# Patient Record
Sex: Male | Born: 1945
Health system: Southern US, Community
[De-identification: ages and names within clinical notes are randomized; demographics above are authoritative.]

## PROBLEM LIST (undated history)

## (undated) DIAGNOSIS — I10 Essential (primary) hypertension: Secondary | ICD-10-CM

## (undated) HISTORY — PX: BACK SURGERY: SHX140

## (undated) HISTORY — PX: ORTHOPEDIC SURGERY: SHX850

---

## 1997-10-28 ENCOUNTER — Ambulatory Visit (HOSPITAL_BASED_OUTPATIENT_CLINIC_OR_DEPARTMENT_OTHER): Admission: RE | Admit: 1997-10-28 | Discharge: 1997-10-28 | Payer: Self-pay | Admitting: Orthopedic Surgery

## 1998-03-29 ENCOUNTER — Ambulatory Visit (HOSPITAL_COMMUNITY): Admission: RE | Admit: 1998-03-29 | Discharge: 1998-03-29 | Payer: Self-pay | Admitting: Orthopedic Surgery

## 1999-05-25 ENCOUNTER — Ambulatory Visit (HOSPITAL_BASED_OUTPATIENT_CLINIC_OR_DEPARTMENT_OTHER): Admission: RE | Admit: 1999-05-25 | Discharge: 1999-05-25 | Payer: Self-pay | Admitting: Orthopedic Surgery

## 2006-05-03 ENCOUNTER — Ambulatory Visit (HOSPITAL_COMMUNITY): Admission: RE | Admit: 2006-05-03 | Discharge: 2006-05-03 | Payer: Self-pay | Admitting: Pulmonary Disease

## 2006-05-18 ENCOUNTER — Ambulatory Visit (HOSPITAL_COMMUNITY): Admission: RE | Admit: 2006-05-18 | Discharge: 2006-05-18 | Payer: Self-pay | Admitting: Pulmonary Disease

## 2006-05-23 ENCOUNTER — Ambulatory Visit (HOSPITAL_COMMUNITY): Admission: RE | Admit: 2006-05-23 | Discharge: 2006-05-23 | Payer: Self-pay | Admitting: Pulmonary Disease

## 2006-05-29 ENCOUNTER — Ambulatory Visit (HOSPITAL_COMMUNITY): Admission: RE | Admit: 2006-05-29 | Discharge: 2006-05-29 | Payer: Self-pay | Admitting: Pulmonary Disease

## 2006-06-15 ENCOUNTER — Ambulatory Visit (HOSPITAL_COMMUNITY): Admission: RE | Admit: 2006-06-15 | Discharge: 2006-06-15 | Payer: Self-pay | Admitting: General Surgery

## 2006-10-04 ENCOUNTER — Observation Stay (HOSPITAL_COMMUNITY): Admission: RE | Admit: 2006-10-04 | Discharge: 2006-10-05 | Payer: Self-pay | Admitting: Neurosurgery

## 2007-03-12 ENCOUNTER — Ambulatory Visit (HOSPITAL_COMMUNITY): Admission: RE | Admit: 2007-03-12 | Discharge: 2007-03-12 | Payer: Self-pay | Admitting: Pulmonary Disease

## 2008-10-21 ENCOUNTER — Ambulatory Visit (HOSPITAL_COMMUNITY): Admission: RE | Admit: 2008-10-21 | Discharge: 2008-10-21 | Payer: Self-pay | Admitting: Pulmonary Disease

## 2010-06-14 NOTE — Op Note (Signed)
William Saunders, William Saunders               ACCOUNT NO.:  [id number]   MEDICAL RECORD NO.:  [id number]          PATIENT TYPE:  INP   LOCATION:  5714                         FACILITY:  [facility]   PHYSICIAN:  Clydene Fake, M.D.  DATE OF BIRTH:  [birth year]   DATE OF PROCEDURE:  10/04/2006  DATE OF DISCHARGE:                               OPERATIVE REPORT   PREOPERATIVE DIAGNOSIS:  Herniated nucleus pulposus, spondylosis with  myelopathy, cervical stenosis C4-C5 and C5-C6.   POSTOPERATIVE DIAGNOSIS:  Herniated nucleus pulposus, spondylosis with  myelopathy, cervical stenosis C4-C5 and C5-C6.   PROCEDURE:  Anterior cervical decompression, discectomy and fusion at C4-  C5 and C5-C6, with allograft bone, anterior cervical plate.   SURGEON:  Clydene Fake, M.D.   ASSISTANT:  Hilda Lias, M.D.   ANESTHESIA:  General endotracheal tube anesthesia.   BLOOD LOSS:  Minimal.   BLOOD REPLACED:  None.   DRAINS:  None.   COMPLICATIONS:  None.   REASON FOR PROCEDURE:  The patient is a 65 year old gentleman who has  had left sided neck pain and scapular pain and found to have brisk  reflexes and accompanied clonus, decreased sensation in the left C8  distribution, positive Spurling's to the left. MRI was done showing  spinal changes at multiple levels, worse at C4-C5 and C5-C6, with a  large central disc herniation causing cord compression on the left side  at C4-C5 and bilateral foraminal narrowing at C5-C6 due to spondylitic  change, worse on the left with some disc herniation.  The patient is  brought in for decompression and fusion at C4-C5 and C5-C6.   PROCEDURE IN DETAIL:  The patient was brought into the operating room  and general anesthesia induced.  The patient was placed in 10 pounds  halter traction, prepped and draped in a sterile fashion.  The site of  incision was injected 10 mL of 1% lidocaine with epinephrine.  An  incision was then made from the midline to the anterior border  of the  sternocleidomastoid muscle on the left side.  Neck incision taken down  to the platysma and hemostasis obtained with Bovie cauterization.  The  platysma was incised with a Bovie and blunt dissection taken through the  anterior cervical fascia to the anterior cervical spine.  Two needles  were placed in the interspaces, x-rays obtained, the top needle pushed  out until it was over the 5 body, the second needle was in the C5-C6  disc space.  The needles were removed as the C5-C6 disc space was  incised with a 15 blade and partial discectomy performed with pituitary  rongeurs.  The longus coli muscle was reflected laterally on each side  using the Bovie from C4 through C6 and a self-retaining retractor was  placed.  The C4-C5 and C5-C6 disc spaces were again incised with a 15  blade.  Discectomy was started pituitary rongeurs and curets.  Anterior  osteophytes were removed with Kerrison punches.  The microscope was  brought in for microdissection at this point.  Discectomy continued with  curets and pituitary rongeurs and then 1  and 2 mm Kerrison punches were  used to remove posterior ligament, posterior disc and osteophytes.  This  decompressed the central canal.  There was a large free fragment of disc  that was obtained at the C4-C5 space coming from behind the C5 vertical  body and this decompressed the central canal.  We were able to remove  the ligament and continue with the central decompression and then  bilateral foraminotomies at the C4-C5 level.  When we were finished, we  had good central and lateral decompression.  A high speed drill was used  to remove cartilaginous endplates.  We measured the height of the disc  space to be 5 mm.  Gelfoam was placed for hemostasis.  Attention was  taken to the C5-C6 level.  Again, a high speed drill was used to remove  cartilaginous endplates and posterior osteophytes, 1 and 2 mm Kerrison  punches were used to remove the posterior  osteophytes, the disc, and  ligament, decompressing the central canal and performing bilateral  foraminotomies.  There was significant pressure on the left side and  when we were finished, we had good decompression of the bilateral C6  roots along with the central canal.  Hemostasis was obtained with  Gelfoam and thrombin.  This was removed.  The height of the disc space  was measured to be 5 mm and a 5 mm allograft bone was tapped in place  and countersunk a couple of mm.  We removed the Gelfoam from C4-C5.  We  had good hemostasis.  We irrigated with antibiotic solution and tapped a  5 mm bone plug into place, countersinking a couple of mm.  Distraction  pins were removed.  Hemostasis was obtained with Gelfoam and thrombin.  The weight was removed from the traction.  The bone was firmly in place  and in good position.  A tressel anterior cervical plate was placed over  the anterior cervical spine.  Two screws were placed into C4, two in C5,  and two more into C6.  These were tightened down. Lateral x-rays were  obtained showing good position of the plate, screws, and interbody bone  plugs at the C4-C5 and C5-C6 level.  The wound was irrigated with  antibiotic solution.  The retractors were removed.  Hemostasis was  obtained with bipolar cauterization and Gelfoam and thrombin.  We  irrigated with antibiotic solution.  When we had good hemostasis, the  platysma was closed with 3-0 Vicryl interrupted sutures, subcutaneous  tissue closed with the same, and skin closed with benzoin and Steri-  Strips.  A dressing was placed.  The patient was placed in a soft  cervical collar, awakened from anesthesia, and transferred to the  recovery room in stable condition.           ______________________________  Clydene Fake, M.D.     JRH/MEDQ  D:  10/04/2006  T:  10/04/2006  Job:  [id number]

## 2010-06-17 NOTE — Op Note (Signed)
[place]  Patient:    SAMUELE, STOREY                        MRN: [id number] Proc. Date: 05/25/99 Attending:  Artist Pais. Mina Marble, M.D. CC:         Artist Pais. Mina Marble, M.D., [address], [place], [place] (2                           Operative Report  PREOPERATIVE DIAGNOSIS:  Left shoulder impingement syndrome.  POSTOPERATIVE DIAGNOSIS:  Left shoulder impingement syndrome.  PROCEDURE:  Left shoulder arthroscopy with subacromial decompression.  SURGEON:  Artist Pais. Mina Marble, M.D.  ASSISTANT:  Nicki Reaper, M.D.  ANESTHESIA:  General with interscalen block.  COMPLICATIONS:  None.  DRAINS:  None.  DESCRIPTION OF PROCEDURE:  Patient was taken to the operating room and after the induction of adequate general anesthesia, the left upper extremity was prepped nd draped in the usual sterile fashion.  Patient was placed in a beach chair position and all bony prominences were well-padded.  At this point in time, an 18-gauge spinal needle was used to enter the glenohumeral joint posteriorly and 30 cc of  normal saline were instilled into the joint.  Once this was done, the joint was  entered using a blunt trocar and visualization of the joint revealed an intact biceps tendon.  The subscapularis tendon was intact and there was no evidence of SLAP lesion present.  An 18-gauge spinal needle was then used to establish an anterior outflow portal.  Visualization of the rotator cuff revealed no rotator  cuff tear throughout the supraspinatus and infraspinatus portions of the rotator cuff.  At this point in time, the scope was placed in the subacromial space, where a significant synovial bursitis was encountered.  A lateral portal was established under direct vision and using a combination of a suction shaver and the ArthroCare wand, a complete bursectomy/synovectomy was performed of the subacromial space.  Once this was done, the anterolateral  edge of the acromion was identified and using a 4-0 bur as well as an ENT rasp through the lateral portal, a subacromial decompression was undertaken.  The [facility] joint appeared to be non-hypertrophic and  there was no evidence of distal clavicle arthritic changes; therefore, the distal clavicle was not excised.  After the subacromial decompression was completed, continuation of the bursectomy was performed into the posterior aspect of the acromion.  Once a complete bursectomy was performed, the instruments were removed from the portals, the portals were closed with 4-0 nylon and Steri-Strips, 4 x s, fluffs and a compressive shoulder dressing were applied.  The patient tolerated the procedure well and went to the recovery room in stable fashion.DD:  05/25/99 TD:  05/25/99 Job: [id number]

## 2010-06-17 NOTE — H&P (Signed)
NAME:  William Saunders, William Saunders               ACCOUNT NO.:  [id number]   MEDICAL RECORD NO.:  [id number]          PATIENT TYPE:  AMB   LOCATION:  DAY                           FACILITY:  [facility]   PHYSICIAN:  Dalia Heading, M.D.  DATE OF BIRTH:  25-Mar-1945   DATE OF ADMISSION:  DATE OF DISCHARGE:  LH                              HISTORY & PHYSICAL   CHIEF COMPLAINT:  Need for screening colonoscopy.   HISTORY OF PRESENT ILLNESS:  The patient is a 65 year old white male who  is referred for endoscopic evaluation.  He needs a colonoscopy for  screening purposes.  No abdominal pain, weight loss, nausea, vomiting,  diarrhea, constipation, melena, hematochezia have been noted.  He has  never had a colonoscopy.  There is no family history of colon carcinoma.   PAST MEDICAL HISTORY:  Includes hypertension.   PAST SURGICAL HISTORY:  Left elbow surgery, left shoulder surgery,  ganglion cyst removal.   CURRENT MEDICATIONS:  Advil, a blood pressure pill, Nexium.   ALLERGIES:  No known drug allergies.   REVIEW OF SYSTEMS:  The patient smokes 2 packs of cigarettes a day.  He  denies any alcohol use.  He denies any other cardiopulmonary  difficulties or bleeding disorders.   PHYSICAL EXAMINATION:  The patient is a well-developed, well-nourished  white male in no acute distress.  LUNGS:  Clear to auscultation with equal breath sounds bilaterally.  HEART:  Examination reveals a regular rate and rhythm without S3, S4 or  murmurs.  ABDOMEN:  Soft, nontender, nondistended.  No hepatosplenomegaly or  masses are noted.  RECTAL:  Examination deferred to the procedure.   IMPRESSION:  Need for screening colonoscopy, and the patient is  scheduled for a colonoscopy on Jun 15, 2006.  The risks and benefits of  the procedure including bleeding or perforation were fully explained to  the patient.  He gave informed consent.      Dalia Heading, M.D.  Electronically Signed     MAJ/MEDQ  D:  05/31/2006  T:   05/31/2006  Job:  [id number]   cc:   Ramon Dredge L. Juanetta Gosling, M.D.  Fax: [phone]

## 2010-11-11 LAB — URINALYSIS, ROUTINE W REFLEX MICROSCOPIC
Bilirubin Urine: NEGATIVE
Glucose, UA: NEGATIVE
Hgb urine dipstick: NEGATIVE
Protein, ur: NEGATIVE
Urobilinogen, UA: 0.2

## 2010-11-11 LAB — CBC
MCHC: 33.4
RDW: 14.1 — ABNORMAL HIGH

## 2010-11-11 LAB — BASIC METABOLIC PANEL
BUN: 11
Calcium: 9.5
Creatinine, Ser: 1.01
GFR calc non Af Amer: >60
Glucose, Bld: 106 — ABNORMAL HIGH

## 2010-11-11 LAB — PROTIME-INR
INR: 1
Prothrombin Time: 13.2

## 2011-08-24 ENCOUNTER — Encounter (HOSPITAL_COMMUNITY): Payer: Self-pay | Admitting: Emergency Medicine

## 2011-08-24 ENCOUNTER — Emergency Department (HOSPITAL_COMMUNITY)
Admission: EM | Admit: 2011-08-24 | Discharge: 2011-08-25 | Disposition: A | Discharge status: Home / Self Care | Payer: Medicare Other | Attending: Emergency Medicine | Admitting: Emergency Medicine

## 2011-08-24 DIAGNOSIS — Z8673 Personal history of transient ischemic attack (TIA), and cerebral infarction without residual deficits: Secondary | ICD-10-CM | POA: Insufficient documentation

## 2011-08-24 DIAGNOSIS — F17200 Nicotine dependence, unspecified, uncomplicated: Secondary | ICD-10-CM | POA: Insufficient documentation

## 2011-08-24 DIAGNOSIS — Z981 Arthrodesis status: Secondary | ICD-10-CM | POA: Insufficient documentation

## 2011-08-24 DIAGNOSIS — E119 Type 2 diabetes mellitus without complications: Secondary | ICD-10-CM | POA: Insufficient documentation

## 2011-08-24 DIAGNOSIS — I10 Essential (primary) hypertension: Secondary | ICD-10-CM | POA: Insufficient documentation

## 2011-08-24 HISTORY — DX: Essential (primary) hypertension: I10

## 2011-08-24 NOTE — ED Notes (Signed)
Patient c/o high blood pressure that started earlier tonight; also c/o headache; states it feels like a pressure.

## 2011-08-25 ENCOUNTER — Encounter (HOSPITAL_COMMUNITY): Payer: Self-pay | Admitting: *Deleted

## 2011-08-25 MED ORDER — CLONIDINE HCL 0.1 MG PO TABS
0.1000 mg | ORAL_TABLET | Freq: Once | ORAL | Status: AC
  Administered 2011-08-25: 0.1 mg via ORAL
  Filled 2011-08-25: qty 1

## 2011-08-25 MED ORDER — LORAZEPAM 1 MG PO TABS
0.5000 mg | ORAL_TABLET | Freq: Once | ORAL | Status: AC
  Administered 2011-08-25: 1 mg via ORAL
  Filled 2011-08-25: qty 1

## 2011-08-25 NOTE — ED Provider Notes (Signed)
History     CSN: [id number]  Arrival date & time 08/24/11  2346   First MD Initiated Contact with Patient 08/24/11 2356      Chief Complaint  Patient presents with  . Hypertension    (Consider location/radiation/quality/duration/timing/severity/associated sxs/prior treatment) HPI   William Saunders is a 66 y.o. male who presents to the Emergency Department complaining of high blood pressure. He is concerned because his brother died of a stroke and he, himself has had a TIA in the past.He had one of his blood pressure medicines (amlodipine) discontinued on 08/02/2011 when he developed lower extremity edema. He is now only on benazepril and occasional prn lasix. Blood pressures have been in the 140 range until tonight when he recorded 210-230/102-110. He denies vision changes, headache, difficulty speaking or swallowing, focal weakness.  PCP Dr. Juanetta Gosling   Past Medical History  Diagnosis Date  . Hypertension   . Diabetes mellitus     Past Surgical History  Procedure Date  . Orthopedic surgery   . Back surgery     C4,5,6 Fusion    No family history on file.  History  Substance Use Topics  . Smoking status: Current Everyday Smoker -- 2.0 packs/day    Types: Cigarettes  . Smokeless tobacco: Not on file  . Alcohol Use: No      Review of Systems  Constitutional: Negative for fever.       10 Systems reviewed and are negative for acute change except as noted in the HPI.  HENT: Negative for congestion.   Eyes: Negative for discharge and redness.  Respiratory: Negative for cough and shortness of breath.   Cardiovascular: Negative for chest pain.  Gastrointestinal: Negative for vomiting and abdominal pain.  Musculoskeletal: Negative for back pain.  Skin: Negative for rash.  Neurological: Negative for syncope, numbness and headaches.  Psychiatric/Behavioral:       No behavior change.    Allergies  Review of patient's allergies indicates no known allergies.  Home  Medications   Current Outpatient Rx  Name Route Sig Dispense Refill  . ALPRAZOLAM 1 MG PO TABS Oral Take 1 mg by mouth at bedtime as needed.    . ASPIRIN 81 MG PO TABS Oral Take 81 mg by mouth daily.    Marland Kitchen BENAZEPRIL HCL 40 MG PO TABS Oral Take 40 mg by mouth daily.    . FUROSEMIDE 40 MG PO TABS Oral Take 40 mg by mouth daily.    Marland Kitchen HYDROCODONE-ACETAMINOPHEN 10-325 MG PO TABS Oral Take 1 tablet by mouth every 6 (six) hours as needed.    Marland Kitchen OMEPRAZOLE 20 MG PO CPDR Oral Take 20 mg by mouth daily.    Marland Kitchen POTASSIUM CHLORIDE CRYS ER 20 MEQ PO TBCR Oral Take 20 mEq by mouth as needed.      BP 213/99  Pulse 73  Temp 98.3 F (36.8 C) (Oral)  Resp 18  Ht 5\' 11"  (1.803 m)  Wt 235 lb (106.595 kg)  BMI 32.78 kg/m2  SpO2 98%  Physical Exam  Nursing note and vitals reviewed. Constitutional: He is oriented to person, place, and time. He appears well-developed and well-nourished.       Awake, alert, nontoxic appearance.Anxious  HENT:  Head: Atraumatic.  Eyes: Right eye exhibits no discharge. Left eye exhibits no discharge.  Neck: Neck supple.  Cardiovascular: Normal heart sounds.   Pulmonary/Chest: Effort normal and breath sounds normal. He exhibits no tenderness.  Abdominal: Soft. Bowel sounds are normal. There is no tenderness.  There is no rebound.  Musculoskeletal: He exhibits edema. He exhibits no tenderness.       Baseline ROM, no obvious new focal weakness.1+ edema, no pitting  Neurological: He is alert and oriented to person, place, and time. No cranial nerve deficit. Coordination normal.       Mental status and motor strength appears baseline for patient and situation.  Skin: No rash noted.  Psychiatric: He has a normal mood and affect.    ED Course  Procedures (including critical care time)      MDM  Patient here with elevated blood pressure, anxious regarding his having a stroke. Initial BP 213/99. Given clonidine and ativan  with blood pressure response 174/98 and decrease in  anxiety. Discussed changes in medication and f/u with Dr. Juanetta Gosling. Pt stable in ED with no significant deterioration in condition.The patient appears reasonably screened and/or stabilized for discharge and I doubt any other medical condition or other [facility] requiring further screening, evaluation, or treatment in the ED at this time prior to discharge.  MDM Reviewed: nursing note and vitals           Nicoletta Dress. Colon Branch, MD 08/25/11 4098

## 2011-08-25 NOTE — ED Notes (Signed)
Pt c/o slight head pressure; otherwise assessment unremarkable

## 2011-08-25 NOTE — ED Notes (Signed)
A&ox4; in no distress; instructions reviewed and f/u information provided.  Verbalizes understanding.

## 2014-02-18 DIAGNOSIS — G629 Polyneuropathy, unspecified: Secondary | ICD-10-CM | POA: Diagnosis not present

## 2014-02-18 DIAGNOSIS — E785 Hyperlipidemia, unspecified: Secondary | ICD-10-CM | POA: Diagnosis not present

## 2014-02-18 DIAGNOSIS — M545 Low back pain: Secondary | ICD-10-CM | POA: Diagnosis not present

## 2014-02-18 DIAGNOSIS — E119 Type 2 diabetes mellitus without complications: Secondary | ICD-10-CM | POA: Diagnosis not present

## 2014-02-19 DIAGNOSIS — I10 Essential (primary) hypertension: Secondary | ICD-10-CM | POA: Diagnosis not present

## 2014-02-19 DIAGNOSIS — E119 Type 2 diabetes mellitus without complications: Secondary | ICD-10-CM | POA: Diagnosis not present

## 2014-02-19 DIAGNOSIS — E785 Hyperlipidemia, unspecified: Secondary | ICD-10-CM | POA: Diagnosis not present

## 2014-05-05 DIAGNOSIS — L0291 Cutaneous abscess, unspecified: Secondary | ICD-10-CM | POA: Diagnosis not present

## 2014-05-20 ENCOUNTER — Other Ambulatory Visit (HOSPITAL_COMMUNITY): Payer: Self-pay | Admitting: Pulmonary Disease

## 2014-05-20 DIAGNOSIS — I739 Peripheral vascular disease, unspecified: Secondary | ICD-10-CM | POA: Diagnosis not present

## 2014-05-20 DIAGNOSIS — R252 Cramp and spasm: Secondary | ICD-10-CM | POA: Diagnosis not present

## 2014-05-20 DIAGNOSIS — I10 Essential (primary) hypertension: Secondary | ICD-10-CM | POA: Diagnosis not present

## 2014-05-20 DIAGNOSIS — E1165 Type 2 diabetes mellitus with hyperglycemia: Secondary | ICD-10-CM | POA: Diagnosis not present

## 2014-05-22 ENCOUNTER — Ambulatory Visit (HOSPITAL_COMMUNITY)
Admission: RE | Admit: 2014-05-22 | Discharge: 2014-05-22 | Disposition: A | Discharge status: Home / Self Care | Payer: Medicare Other | Source: Ambulatory Visit | Attending: Pulmonary Disease | Admitting: Pulmonary Disease

## 2014-05-22 DIAGNOSIS — I739 Peripheral vascular disease, unspecified: Secondary | ICD-10-CM

## 2014-05-22 DIAGNOSIS — M79604 Pain in right leg: Secondary | ICD-10-CM | POA: Diagnosis not present

## 2014-05-22 DIAGNOSIS — I10 Essential (primary) hypertension: Secondary | ICD-10-CM | POA: Diagnosis not present

## 2014-05-22 DIAGNOSIS — M79605 Pain in left leg: Secondary | ICD-10-CM | POA: Diagnosis not present

## 2014-05-22 DIAGNOSIS — R252 Cramp and spasm: Secondary | ICD-10-CM | POA: Diagnosis not present

## 2014-05-22 DIAGNOSIS — E1165 Type 2 diabetes mellitus with hyperglycemia: Secondary | ICD-10-CM | POA: Diagnosis not present

## 2014-08-24 DIAGNOSIS — Z1211 Encounter for screening for malignant neoplasm of colon: Secondary | ICD-10-CM | POA: Diagnosis not present

## 2014-08-24 DIAGNOSIS — J449 Chronic obstructive pulmonary disease, unspecified: Secondary | ICD-10-CM | POA: Diagnosis not present

## 2014-08-24 DIAGNOSIS — I10 Essential (primary) hypertension: Secondary | ICD-10-CM | POA: Diagnosis not present

## 2014-08-24 DIAGNOSIS — E236 Other disorders of pituitary gland: Secondary | ICD-10-CM | POA: Diagnosis not present

## 2014-08-24 DIAGNOSIS — E785 Hyperlipidemia, unspecified: Secondary | ICD-10-CM | POA: Diagnosis not present

## 2014-08-24 DIAGNOSIS — Z0000 Encounter for general adult medical examination without abnormal findings: Secondary | ICD-10-CM | POA: Diagnosis not present

## 2014-08-24 DIAGNOSIS — E1165 Type 2 diabetes mellitus with hyperglycemia: Secondary | ICD-10-CM | POA: Diagnosis not present

## 2014-11-23 DIAGNOSIS — G629 Polyneuropathy, unspecified: Secondary | ICD-10-CM | POA: Diagnosis not present

## 2014-11-23 DIAGNOSIS — Z23 Encounter for immunization: Secondary | ICD-10-CM | POA: Diagnosis not present

## 2014-11-23 DIAGNOSIS — J449 Chronic obstructive pulmonary disease, unspecified: Secondary | ICD-10-CM | POA: Diagnosis not present

## 2014-11-23 DIAGNOSIS — I10 Essential (primary) hypertension: Secondary | ICD-10-CM | POA: Diagnosis not present

## 2014-11-23 DIAGNOSIS — M545 Low back pain: Secondary | ICD-10-CM | POA: Diagnosis not present

## 2014-11-24 DIAGNOSIS — E1165 Type 2 diabetes mellitus with hyperglycemia: Secondary | ICD-10-CM | POA: Diagnosis not present

## 2015-02-23 DIAGNOSIS — M1990 Unspecified osteoarthritis, unspecified site: Secondary | ICD-10-CM | POA: Diagnosis not present

## 2015-02-23 DIAGNOSIS — E1165 Type 2 diabetes mellitus with hyperglycemia: Secondary | ICD-10-CM | POA: Diagnosis not present

## 2015-02-23 DIAGNOSIS — K210 Gastro-esophageal reflux disease with esophagitis: Secondary | ICD-10-CM | POA: Diagnosis not present

## 2015-02-23 DIAGNOSIS — J449 Chronic obstructive pulmonary disease, unspecified: Secondary | ICD-10-CM | POA: Diagnosis not present

## 2015-02-23 DIAGNOSIS — M25512 Pain in left shoulder: Secondary | ICD-10-CM | POA: Diagnosis not present

## 2015-02-23 DIAGNOSIS — I10 Essential (primary) hypertension: Secondary | ICD-10-CM | POA: Diagnosis not present

## 2015-03-08 DIAGNOSIS — M25512 Pain in left shoulder: Secondary | ICD-10-CM | POA: Diagnosis not present

## 2015-03-08 DIAGNOSIS — G8929 Other chronic pain: Secondary | ICD-10-CM | POA: Diagnosis not present

## 2015-03-09 ENCOUNTER — Other Ambulatory Visit (HOSPITAL_COMMUNITY): Payer: Self-pay | Admitting: Orthopedic Surgery

## 2015-03-09 DIAGNOSIS — G8929 Other chronic pain: Secondary | ICD-10-CM

## 2015-03-09 DIAGNOSIS — M25512 Pain in left shoulder: Principal | ICD-10-CM

## 2015-03-19 ENCOUNTER — Ambulatory Visit (HOSPITAL_COMMUNITY): Payer: Medicare Other

## 2015-03-22 ENCOUNTER — Ambulatory Visit (HOSPITAL_COMMUNITY)
Admission: RE | Admit: 2015-03-22 | Discharge: 2015-03-22 | Disposition: A | Discharge status: Home / Self Care | Payer: Medicare Other | Source: Ambulatory Visit | Attending: Orthopedic Surgery | Admitting: Orthopedic Surgery

## 2015-03-22 DIAGNOSIS — M7582 Other shoulder lesions, left shoulder: Secondary | ICD-10-CM | POA: Diagnosis not present

## 2015-03-22 DIAGNOSIS — R600 Localized edema: Secondary | ICD-10-CM | POA: Insufficient documentation

## 2015-03-22 DIAGNOSIS — M25512 Pain in left shoulder: Secondary | ICD-10-CM | POA: Diagnosis not present

## 2015-03-22 DIAGNOSIS — M19012 Primary osteoarthritis, left shoulder: Secondary | ICD-10-CM | POA: Insufficient documentation

## 2015-03-22 DIAGNOSIS — G8929 Other chronic pain: Secondary | ICD-10-CM | POA: Insufficient documentation

## 2015-03-22 DIAGNOSIS — M75112 Incomplete rotator cuff tear or rupture of left shoulder, not specified as traumatic: Secondary | ICD-10-CM | POA: Insufficient documentation

## 2015-03-23 DIAGNOSIS — M25512 Pain in left shoulder: Secondary | ICD-10-CM | POA: Diagnosis not present

## 2015-04-13 DIAGNOSIS — M25512 Pain in left shoulder: Secondary | ICD-10-CM | POA: Diagnosis not present

## 2015-04-13 DIAGNOSIS — G8929 Other chronic pain: Secondary | ICD-10-CM | POA: Diagnosis not present

## 2015-04-15 ENCOUNTER — Other Ambulatory Visit: Payer: Self-pay | Admitting: Orthopedic Surgery

## 2015-04-15 ENCOUNTER — Ambulatory Visit
Admission: RE | Admit: 2015-04-15 | Discharge: 2015-04-15 | Disposition: A | Discharge status: Home / Self Care | Payer: Medicare Other | Source: Ambulatory Visit | Attending: Orthopedic Surgery | Admitting: Orthopedic Surgery

## 2015-04-15 DIAGNOSIS — R0602 Shortness of breath: Secondary | ICD-10-CM

## 2015-04-15 DIAGNOSIS — M25512 Pain in left shoulder: Secondary | ICD-10-CM | POA: Diagnosis not present

## 2015-04-15 DIAGNOSIS — Z01818 Encounter for other preprocedural examination: Secondary | ICD-10-CM | POA: Diagnosis not present

## 2015-04-20 DIAGNOSIS — E1165 Type 2 diabetes mellitus with hyperglycemia: Secondary | ICD-10-CM | POA: Diagnosis not present

## 2015-04-20 DIAGNOSIS — I10 Essential (primary) hypertension: Secondary | ICD-10-CM | POA: Diagnosis not present

## 2015-04-20 DIAGNOSIS — E1121 Type 2 diabetes mellitus with diabetic nephropathy: Secondary | ICD-10-CM | POA: Diagnosis not present

## 2015-04-20 DIAGNOSIS — J449 Chronic obstructive pulmonary disease, unspecified: Secondary | ICD-10-CM | POA: Diagnosis not present

## 2015-05-11 ENCOUNTER — Encounter (HOSPITAL_BASED_OUTPATIENT_CLINIC_OR_DEPARTMENT_OTHER): Admission: RE | Payer: Self-pay | Source: Ambulatory Visit

## 2015-05-11 ENCOUNTER — Ambulatory Visit (HOSPITAL_BASED_OUTPATIENT_CLINIC_OR_DEPARTMENT_OTHER): Admission: RE | Admit: 2015-05-11 | Payer: Medicare Other | Source: Ambulatory Visit | Admitting: Orthopedic Surgery

## 2015-05-11 SURGERY — SHOULDER ARTHROSCOPY WITH SUBACROMIAL DECOMPRESSION, ROTATOR CUFF REPAIR AND BICEP TENDON REPAIR
Anesthesia: General | Laterality: Left

## 2015-07-26 DIAGNOSIS — E236 Other disorders of pituitary gland: Secondary | ICD-10-CM | POA: Diagnosis not present

## 2015-07-26 DIAGNOSIS — G4700 Insomnia, unspecified: Secondary | ICD-10-CM | POA: Diagnosis not present

## 2015-07-26 DIAGNOSIS — I10 Essential (primary) hypertension: Secondary | ICD-10-CM | POA: Diagnosis not present

## 2015-07-26 DIAGNOSIS — Z0000 Encounter for general adult medical examination without abnormal findings: Secondary | ICD-10-CM | POA: Diagnosis not present

## 2015-07-26 DIAGNOSIS — Z125 Encounter for screening for malignant neoplasm of prostate: Secondary | ICD-10-CM | POA: Diagnosis not present

## 2015-07-26 DIAGNOSIS — E1165 Type 2 diabetes mellitus with hyperglycemia: Secondary | ICD-10-CM | POA: Diagnosis not present

## 2015-08-09 DIAGNOSIS — C44619 Basal cell carcinoma of skin of left upper limb, including shoulder: Secondary | ICD-10-CM | POA: Diagnosis not present

## 2015-08-09 DIAGNOSIS — C44219 Basal cell carcinoma of skin of left ear and external auricular canal: Secondary | ICD-10-CM | POA: Diagnosis not present

## 2015-08-09 DIAGNOSIS — C44212 Basal cell carcinoma of skin of right ear and external auricular canal: Secondary | ICD-10-CM | POA: Diagnosis not present

## 2015-08-30 DIAGNOSIS — L570 Actinic keratosis: Secondary | ICD-10-CM | POA: Diagnosis not present

## 2015-08-30 DIAGNOSIS — Z85828 Personal history of other malignant neoplasm of skin: Secondary | ICD-10-CM | POA: Diagnosis not present

## 2015-08-30 DIAGNOSIS — C44612 Basal cell carcinoma of skin of right upper limb, including shoulder: Secondary | ICD-10-CM | POA: Diagnosis not present

## 2015-08-30 DIAGNOSIS — Z08 Encounter for follow-up examination after completed treatment for malignant neoplasm: Secondary | ICD-10-CM | POA: Diagnosis not present

## 2015-08-30 DIAGNOSIS — X32XXXD Exposure to sunlight, subsequent encounter: Secondary | ICD-10-CM | POA: Diagnosis not present

## 2015-10-25 DIAGNOSIS — L570 Actinic keratosis: Secondary | ICD-10-CM | POA: Diagnosis not present

## 2015-10-25 DIAGNOSIS — C44612 Basal cell carcinoma of skin of right upper limb, including shoulder: Secondary | ICD-10-CM | POA: Diagnosis not present

## 2015-10-25 DIAGNOSIS — X32XXXD Exposure to sunlight, subsequent encounter: Secondary | ICD-10-CM | POA: Diagnosis not present

## 2016-02-29 DIAGNOSIS — E1121 Type 2 diabetes mellitus with diabetic nephropathy: Secondary | ICD-10-CM | POA: Diagnosis not present

## 2016-02-29 DIAGNOSIS — I10 Essential (primary) hypertension: Secondary | ICD-10-CM | POA: Diagnosis not present

## 2016-02-29 DIAGNOSIS — M549 Dorsalgia, unspecified: Secondary | ICD-10-CM | POA: Diagnosis not present

## 2016-02-29 DIAGNOSIS — J449 Chronic obstructive pulmonary disease, unspecified: Secondary | ICD-10-CM | POA: Diagnosis not present

## 2016-04-24 DIAGNOSIS — B078 Other viral warts: Secondary | ICD-10-CM | POA: Diagnosis not present

## 2016-04-24 DIAGNOSIS — X32XXXD Exposure to sunlight, subsequent encounter: Secondary | ICD-10-CM | POA: Diagnosis not present

## 2016-04-24 DIAGNOSIS — Z85828 Personal history of other malignant neoplasm of skin: Secondary | ICD-10-CM | POA: Diagnosis not present

## 2016-04-24 DIAGNOSIS — Z08 Encounter for follow-up examination after completed treatment for malignant neoplasm: Secondary | ICD-10-CM | POA: Diagnosis not present

## 2016-04-24 DIAGNOSIS — L570 Actinic keratosis: Secondary | ICD-10-CM | POA: Diagnosis not present

## 2016-05-29 DIAGNOSIS — M545 Low back pain: Secondary | ICD-10-CM | POA: Diagnosis not present

## 2016-05-29 DIAGNOSIS — I10 Essential (primary) hypertension: Secondary | ICD-10-CM | POA: Diagnosis not present

## 2016-05-29 DIAGNOSIS — J449 Chronic obstructive pulmonary disease, unspecified: Secondary | ICD-10-CM | POA: Diagnosis not present

## 2016-05-29 DIAGNOSIS — E1165 Type 2 diabetes mellitus with hyperglycemia: Secondary | ICD-10-CM | POA: Diagnosis not present

## 2016-09-19 DIAGNOSIS — R609 Edema, unspecified: Secondary | ICD-10-CM | POA: Diagnosis not present

## 2016-09-19 DIAGNOSIS — I10 Essential (primary) hypertension: Secondary | ICD-10-CM | POA: Diagnosis not present

## 2016-09-19 DIAGNOSIS — J449 Chronic obstructive pulmonary disease, unspecified: Secondary | ICD-10-CM | POA: Diagnosis not present

## 2016-09-19 DIAGNOSIS — G629 Polyneuropathy, unspecified: Secondary | ICD-10-CM | POA: Diagnosis not present

## 2016-10-05 ENCOUNTER — Other Ambulatory Visit (HOSPITAL_COMMUNITY): Payer: Self-pay | Admitting: Pulmonary Disease

## 2016-10-05 DIAGNOSIS — R609 Edema, unspecified: Secondary | ICD-10-CM

## 2016-10-09 ENCOUNTER — Ambulatory Visit (HOSPITAL_COMMUNITY)
Admission: RE | Admit: 2016-10-09 | Discharge: 2016-10-09 | Disposition: A | Discharge status: Home / Self Care | Payer: Medicare Other | Source: Ambulatory Visit | Attending: Pulmonary Disease | Admitting: Pulmonary Disease

## 2016-10-09 DIAGNOSIS — R609 Edema, unspecified: Secondary | ICD-10-CM | POA: Insufficient documentation

## 2016-10-09 LAB — ECHOCARDIOGRAM COMPLETE
CHL CUP MV DEC (S): 366
CHL CUP STROKE VOLUME: 43 mL
CHL CUP TV REG PEAK VELOCITY: 277 cm/s
E/e' ratio: 8.34
EWDT: 366 ms
FS: 52 % — AB (ref 28–44)
IV/PV OW: 1.32
LA ID, A-P, ES: 24 mm
LA diam end sys: 24 mm
LA diam index: 1.02 cm/m2
LA vol A4C: 43.8 ml
LDCA: 2.84 cm2
LV E/e' medial: 8.34
LV E/e'average: 8.34
LV TDI E'MEDIAL: 6.31
LV e' LATERAL: 9.25 cm/s
LVDIAVOL: 61 mL — AB (ref 62–150)
LVDIAVOLIN: 26 mL/m2
LVOT VTI: 26.7 cm
LVOT peak grad rest: 9 mmHg
LVOTD: 19 mm
LVOTPV: 149 cm/s
LVOTSV: 76 mL
LVSYSVOL: 19 mL — AB (ref 21–61)
LVSYSVOLIN: 8 mL/m2
Lateral S' vel: 12.7 cm/s
MV Peak grad: 2 mmHg
MV pk A vel: 113 m/s
MV pk E vel: 77.1 m/s
PW: 10.7 mm — AB (ref 0.6–1.1)
RV TAPSE: 28.5 mm
RV sys press: 34 mmHg
Simpson's disk: 69
TDI e' lateral: 9.25
TR max vel: 277 cm/s

## 2016-10-09 NOTE — Progress Notes (Signed)
*  PRELIMINARY RESULTS* Echocardiogram 2D Echocardiogram has been performed.  William Saunders 10/09/2016, 3:35 PM

## 2016-10-19 IMAGING — MR MR SHOULDER*L* W/O CM
5 series · 40 of 40 positions shown · non-contrast
Comparison: None.

CLINICAL DATA: Left shoulder pain for 2 months.

EXAM:
MRI OF THE LEFT SHOULDER WITHOUT CONTRAST
TECHNIQUE: Multiplanar, multisequence MR imaging of the shoulder was performed.
No intravenous contrast was administered.

[Series 4: t2fs_blade_cor · oblique · 3.0mm · 0.62mm/px · 7 of 22 slices shown]
[im 1/22]
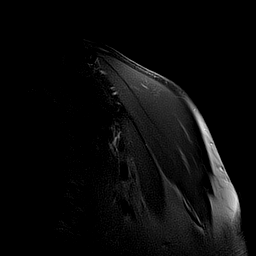
[im 4/22]
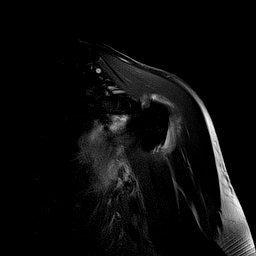
[im 8/22]
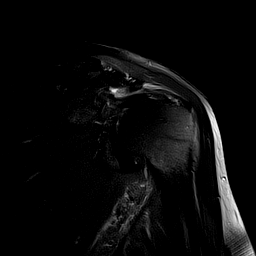
[im 11/22]
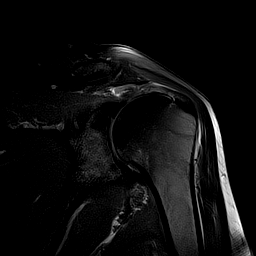
[im 15/22]
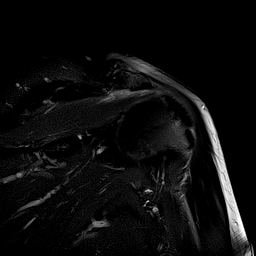
[im 18/22]
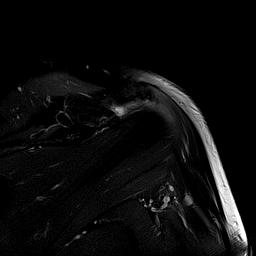
[im 22/22]
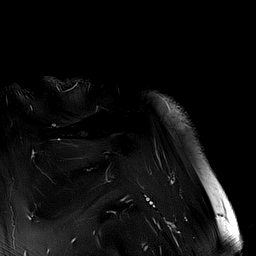

[Series 5: t2fs axial blade · axial · 3.0mm · 0.62mm/px · z∈[-40,+48]mm · 8 of 26 slices shown]
[im 1/26]
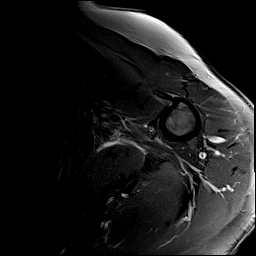
[im 4/26]
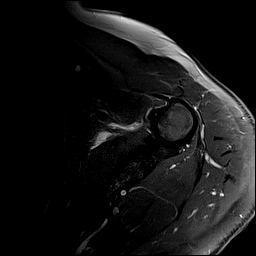
[im 8/26]
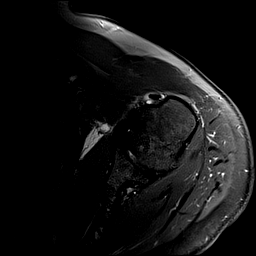
[im 11/26]
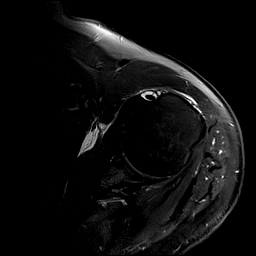
[im 15/26]
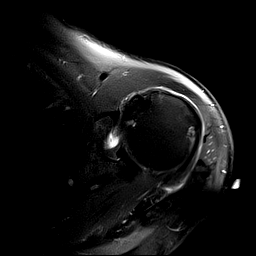
[im 18/26]
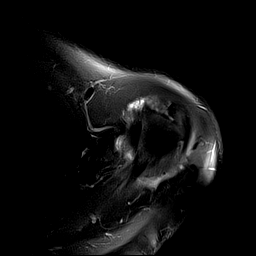
[im 22/26]
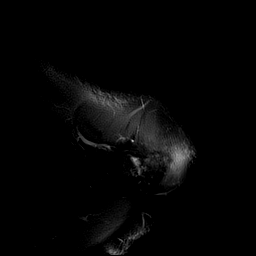
[im 26/26]
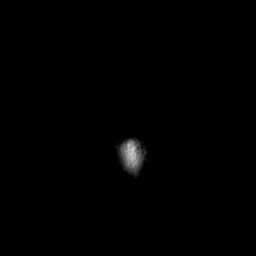

[Series 6: pd_blade_cor · oblique · 3.0mm · 0.62mm/px · 7 of 22 slices shown]
[im 1/22]
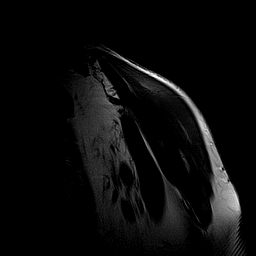
[im 4/22]
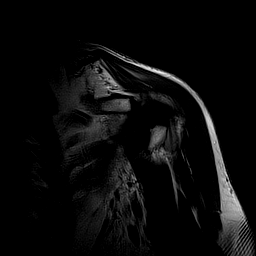
[im 8/22]
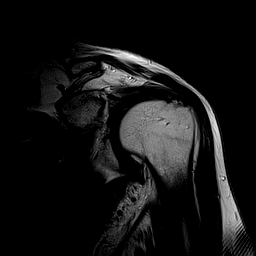
[im 11/22]
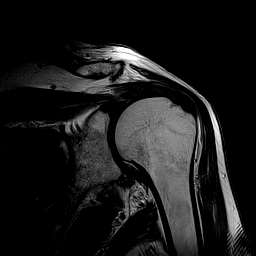
[im 15/22]
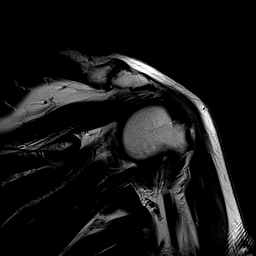
[im 18/22]
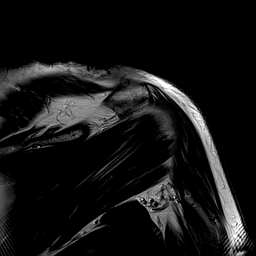
[im 22/22]
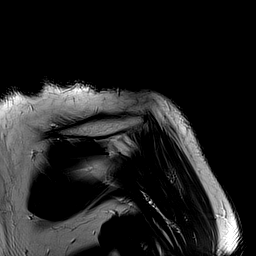

[Series 7: T1 · oblique · 3.0mm · 0.31mm/px · 9 of 28 slices shown]
[im 1/28]
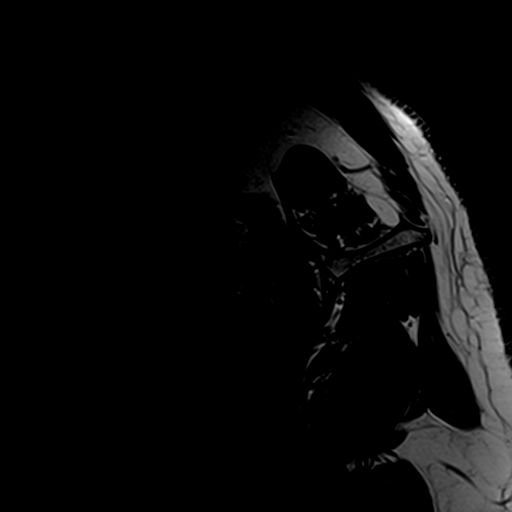
[im 4/28]
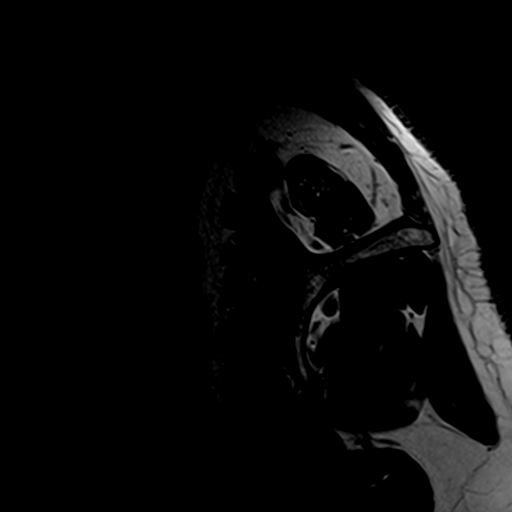
[im 7/28]
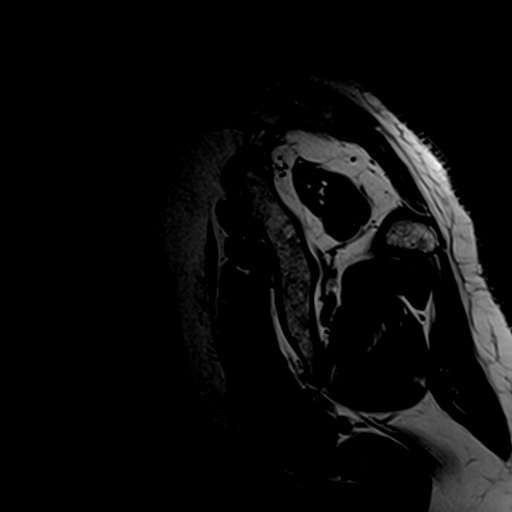
[im 11/28]
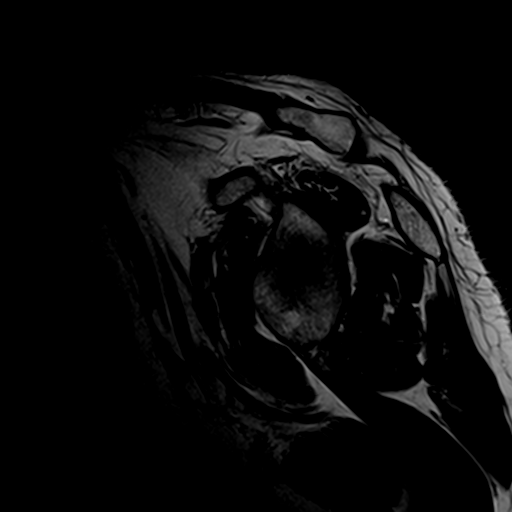
[im 14/28]
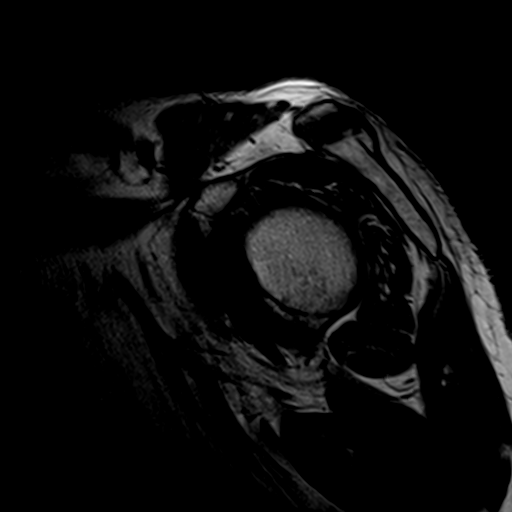
[im 17/28]
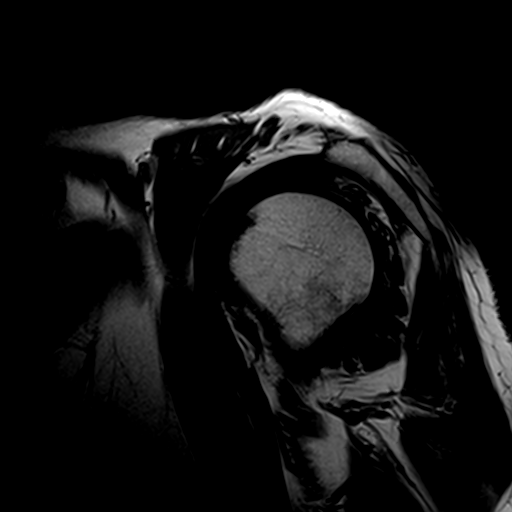
[im 21/28]
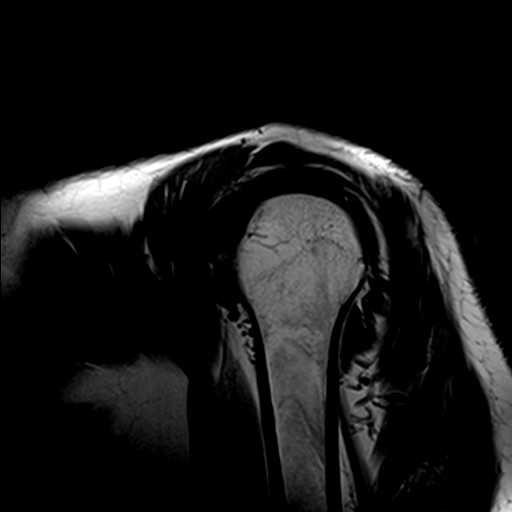
[im 24/28]
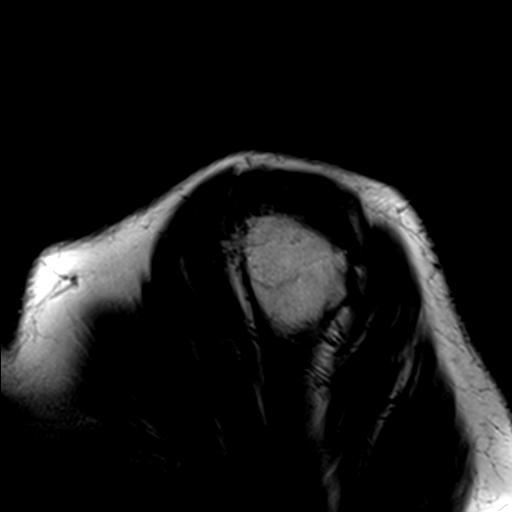
[im 28/28]
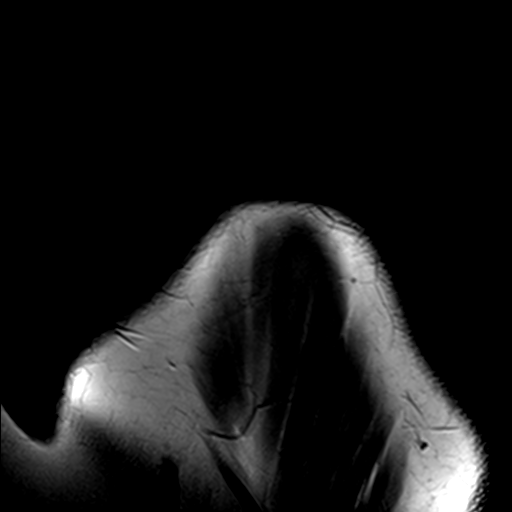

[Series 8: t2fs_blade_sag · oblique · 3.0mm · 0.62mm/px · 9 of 28 slices shown]
[im 1/28]
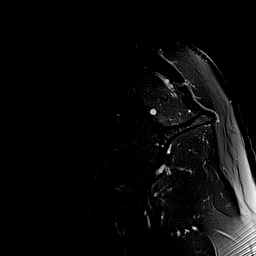
[im 4/28]
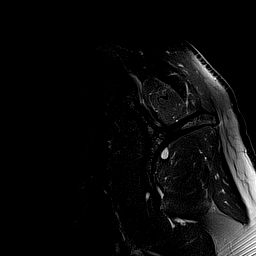
[im 7/28]
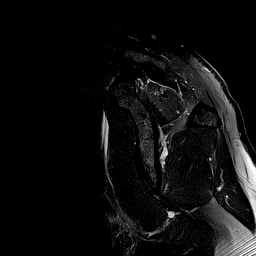
[im 11/28]
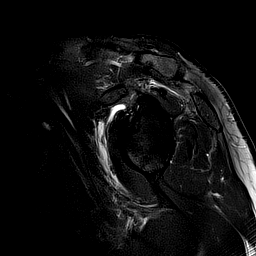
[im 14/28]
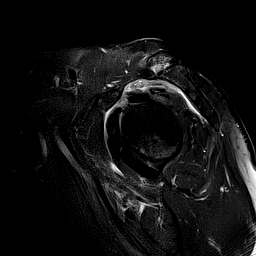
[im 17/28]
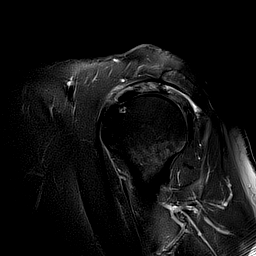
[im 21/28]
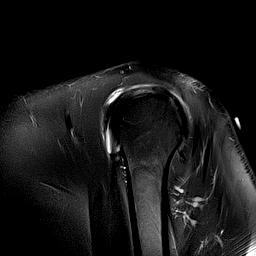
[im 24/28]
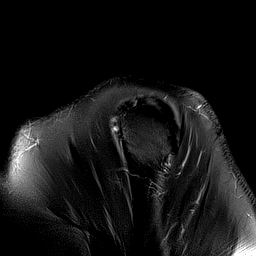
[im 28/28]
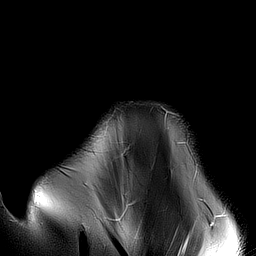

[40 of 40 positions shown; findings below may reference images not displayed]

FINDINGS: Rotator cuff: Full-thickness partial width tear suspected in the
distal supraspinatus tendon on images 7 through 5 of series 8
although the tear only extends obliquely making it difficult to
confirm full-thickness nature. There is laxity and irregularity
along a long segment of the bursal surface of the tendon as on image
13 series 4 suspicious for high-grade partial thickness tearing of
the bursal surface with some tendon delamination. Some of this
appearance could be due to adjacent synovitis in the subacromial
subdeltoid bursa. Moderate to advanced supraspinatus tendinopathy.

Mild subscapularis and infraspinatus tendinopathy.

Muscles:  Unremarkable

Biceps long head:  Mild tendinopathy of the intra-articular segment.

Acromioclavicular Joint: Mild degenerative AC joint arthropathy.
Subacromial morphology is type 2 (curved). There is abnormal fluid
in the subacromial subdeltoid bursa and subcoracoid bursa.

Glenohumeral Joint: Moderate degenerative chondral thinning along
the humeral head and mild chondral thinning along the glenoid.

Labrum:  Grossly unremarkable

Bones: No significant extra-articular osseous abnormalities
identified.

Other: Small amount of infiltrative edema tracks along the left
axilla
IMPRESSION: 1. There is a large partial thickness tear of the bursal surface of
the supraspinatus tendon with suspected tendon delamination.
Distally in the supraspinatus tendon there is a high degree of
tendon irregularity and possible full-thickness partial width
extension obliquely as on image 6 series [DATE]. Mild subscapularis and infraspinatus tendinopathy.
3. Fluid in the subacromial subdeltoid bursa, with potential
synovitis, and fluid extending into the subcoracoid bursa. There is
also mild infiltrative edema in the left axilla.
4. Mild degenerative AC joint arthropathy and mild to moderate
degenerative glenohumeral arthropathy.

## 2016-11-23 DIAGNOSIS — D225 Melanocytic nevi of trunk: Secondary | ICD-10-CM | POA: Diagnosis not present

## 2016-11-23 DIAGNOSIS — L308 Other specified dermatitis: Secondary | ICD-10-CM | POA: Diagnosis not present

## 2016-11-23 DIAGNOSIS — X32XXXD Exposure to sunlight, subsequent encounter: Secondary | ICD-10-CM | POA: Diagnosis not present

## 2016-11-23 DIAGNOSIS — I872 Venous insufficiency (chronic) (peripheral): Secondary | ICD-10-CM | POA: Diagnosis not present

## 2016-11-23 DIAGNOSIS — L570 Actinic keratosis: Secondary | ICD-10-CM | POA: Diagnosis not present

## 2016-11-28 DIAGNOSIS — Z23 Encounter for immunization: Secondary | ICD-10-CM | POA: Diagnosis not present

## 2016-12-26 DIAGNOSIS — E1121 Type 2 diabetes mellitus with diabetic nephropathy: Secondary | ICD-10-CM | POA: Diagnosis not present

## 2016-12-26 DIAGNOSIS — I10 Essential (primary) hypertension: Secondary | ICD-10-CM | POA: Diagnosis not present

## 2016-12-26 DIAGNOSIS — J449 Chronic obstructive pulmonary disease, unspecified: Secondary | ICD-10-CM | POA: Diagnosis not present

## 2016-12-26 DIAGNOSIS — M545 Low back pain: Secondary | ICD-10-CM | POA: Diagnosis not present

## 2017-02-28 DIAGNOSIS — Z79891 Long term (current) use of opiate analgesic: Secondary | ICD-10-CM | POA: Diagnosis not present

## 2017-03-29 DIAGNOSIS — I10 Essential (primary) hypertension: Secondary | ICD-10-CM | POA: Diagnosis not present

## 2017-03-29 DIAGNOSIS — M542 Cervicalgia: Secondary | ICD-10-CM | POA: Diagnosis not present

## 2017-03-29 DIAGNOSIS — E1121 Type 2 diabetes mellitus with diabetic nephropathy: Secondary | ICD-10-CM | POA: Diagnosis not present

## 2017-03-29 DIAGNOSIS — J449 Chronic obstructive pulmonary disease, unspecified: Secondary | ICD-10-CM | POA: Diagnosis not present

## 2017-03-29 DIAGNOSIS — E1165 Type 2 diabetes mellitus with hyperglycemia: Secondary | ICD-10-CM | POA: Diagnosis not present

## 2017-04-10 DIAGNOSIS — H4323 Crystalline deposits in vitreous body, bilateral: Secondary | ICD-10-CM | POA: Diagnosis not present

## 2017-04-10 DIAGNOSIS — H524 Presbyopia: Secondary | ICD-10-CM | POA: Diagnosis not present

## 2017-04-10 DIAGNOSIS — H43813 Vitreous degeneration, bilateral: Secondary | ICD-10-CM | POA: Diagnosis not present

## 2017-04-10 DIAGNOSIS — H25093 Other age-related incipient cataract, bilateral: Secondary | ICD-10-CM | POA: Diagnosis not present

## 2017-07-02 DIAGNOSIS — I129 Hypertensive chronic kidney disease with stage 1 through stage 4 chronic kidney disease, or unspecified chronic kidney disease: Secondary | ICD-10-CM | POA: Diagnosis not present

## 2017-07-02 DIAGNOSIS — J449 Chronic obstructive pulmonary disease, unspecified: Secondary | ICD-10-CM | POA: Diagnosis not present

## 2017-07-02 DIAGNOSIS — M545 Low back pain: Secondary | ICD-10-CM | POA: Diagnosis not present

## 2017-07-02 DIAGNOSIS — E1121 Type 2 diabetes mellitus with diabetic nephropathy: Secondary | ICD-10-CM | POA: Diagnosis not present

## 2017-10-02 DIAGNOSIS — E1165 Type 2 diabetes mellitus with hyperglycemia: Secondary | ICD-10-CM | POA: Diagnosis not present

## 2017-10-02 DIAGNOSIS — Z79891 Long term (current) use of opiate analgesic: Secondary | ICD-10-CM | POA: Diagnosis not present

## 2017-10-02 DIAGNOSIS — K210 Gastro-esophageal reflux disease with esophagitis: Secondary | ICD-10-CM | POA: Diagnosis not present

## 2017-10-02 DIAGNOSIS — I10 Essential (primary) hypertension: Secondary | ICD-10-CM | POA: Diagnosis not present

## 2017-10-02 DIAGNOSIS — Z0000 Encounter for general adult medical examination without abnormal findings: Secondary | ICD-10-CM | POA: Diagnosis not present

## 2017-10-02 DIAGNOSIS — G4700 Insomnia, unspecified: Secondary | ICD-10-CM | POA: Diagnosis not present

## 2018-01-03 DIAGNOSIS — J449 Chronic obstructive pulmonary disease, unspecified: Secondary | ICD-10-CM | POA: Diagnosis not present

## 2018-01-03 DIAGNOSIS — M545 Low back pain: Secondary | ICD-10-CM | POA: Diagnosis not present

## 2018-01-03 DIAGNOSIS — I10 Essential (primary) hypertension: Secondary | ICD-10-CM | POA: Diagnosis not present

## 2018-01-03 DIAGNOSIS — Z23 Encounter for immunization: Secondary | ICD-10-CM | POA: Diagnosis not present

## 2018-01-03 DIAGNOSIS — E1121 Type 2 diabetes mellitus with diabetic nephropathy: Secondary | ICD-10-CM | POA: Diagnosis not present

## 2018-04-08 ENCOUNTER — Other Ambulatory Visit (HOSPITAL_COMMUNITY): Payer: Self-pay | Admitting: Pulmonary Disease

## 2018-04-08 DIAGNOSIS — R634 Abnormal weight loss: Secondary | ICD-10-CM

## 2018-04-23 ENCOUNTER — Ambulatory Visit (HOSPITAL_COMMUNITY)
Admission: RE | Admit: 2018-04-23 | Discharge: 2018-04-23 | Disposition: A | Discharge status: Home / Self Care | Payer: Medicare Other | Source: Ambulatory Visit | Attending: Pulmonary Disease | Admitting: Pulmonary Disease

## 2018-04-23 ENCOUNTER — Other Ambulatory Visit: Payer: Self-pay

## 2018-04-23 DIAGNOSIS — E279 Disorder of adrenal gland, unspecified: Secondary | ICD-10-CM | POA: Diagnosis not present

## 2018-04-23 DIAGNOSIS — R918 Other nonspecific abnormal finding of lung field: Secondary | ICD-10-CM | POA: Diagnosis not present

## 2018-04-23 DIAGNOSIS — R590 Localized enlarged lymph nodes: Secondary | ICD-10-CM | POA: Insufficient documentation

## 2018-04-23 DIAGNOSIS — M899 Disorder of bone, unspecified: Secondary | ICD-10-CM | POA: Diagnosis not present

## 2018-04-23 DIAGNOSIS — N289 Disorder of kidney and ureter, unspecified: Secondary | ICD-10-CM | POA: Insufficient documentation

## 2018-04-23 DIAGNOSIS — R160 Hepatomegaly, not elsewhere classified: Secondary | ICD-10-CM | POA: Insufficient documentation

## 2018-04-23 DIAGNOSIS — R222 Localized swelling, mass and lump, trunk: Secondary | ICD-10-CM | POA: Diagnosis not present

## 2018-04-23 DIAGNOSIS — R634 Abnormal weight loss: Secondary | ICD-10-CM | POA: Diagnosis not present

## 2018-04-23 LAB — POCT I-STAT CREATININE: Creatinine, Ser: 1.2 mg/dL (ref 0.61–1.24)

## 2018-04-23 MED ORDER — IOHEXOL 300 MG/ML  SOLN
100.0000 mL | Freq: Once | INTRAMUSCULAR | Status: AC | PRN
  Administered 2018-04-23: 100 mL via INTRAVENOUS

## 2018-05-31 DEATH — deceased
